# Patient Record
Sex: Male | Born: 1965 | Race: White | Hispanic: No | Marital: Married | Smoking: Never smoker
Health system: Southern US, Community
[De-identification: ages and names within clinical notes are randomized; demographics above are authoritative.]

## PROBLEM LIST (undated history)

## (undated) DIAGNOSIS — K589 Irritable bowel syndrome without diarrhea: Secondary | ICD-10-CM

## (undated) HISTORY — DX: Irritable bowel syndrome without diarrhea: K58.9

---

## 2004-12-18 ENCOUNTER — Ambulatory Visit: Payer: Self-pay | Admitting: Unknown Physician Specialty

## 2012-02-10 ENCOUNTER — Ambulatory Visit: Payer: Self-pay | Admitting: Internal Medicine

## 2012-03-22 ENCOUNTER — Other Ambulatory Visit: Payer: Self-pay | Admitting: Gastroenterology

## 2012-03-22 LAB — CLOSTRIDIUM DIFFICILE BY PCR

## 2012-03-24 LAB — STOOL CULTURE

## 2012-12-09 IMAGING — CT CT ABD-PELV W/ CM
1 of 2 series · 15 of 32 positions shown, 19 images · non-contrast
Comparison: none

REASON FOR EXAM: frequent diarrhea and cramping
COMMENTS:

PROCEDURE:     KCT - KCT ABDOMEN/PELVIS W  - February 10, 2012  [DATE]
RESULT:
TECHNIQUE: CT of the abdomen and pelvis is performed with 100 ml of
Xsovue-ORT iodinated intravenous contrast and oral contrast with images
reconstructed in the axial plane at 3.0 mm slice thickness.
There is no previous exam for comparison.

[Series 2: abd 3mm w 3.0 i40f 3 · axial · 0.76mm/px · z∈[-592,-160]mm · 15 of 158 slices shown, 19 images]
[im 7/158  soft-tissue]
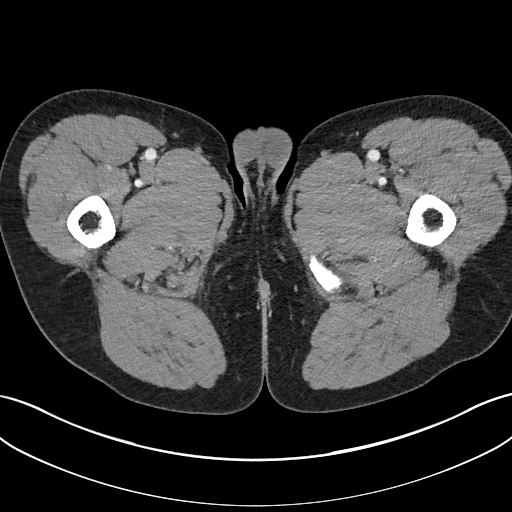
[im 7/158  bone]
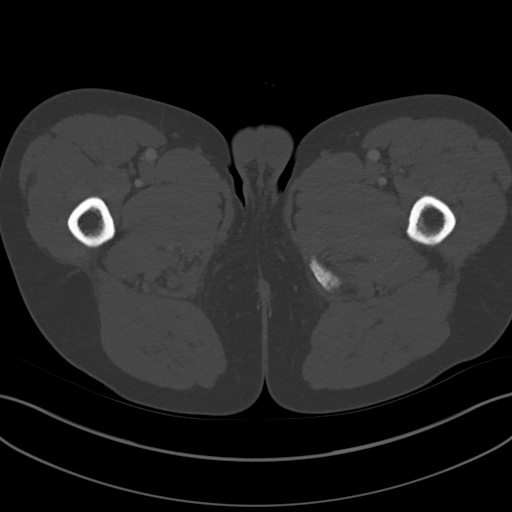
[im 19/158  soft-tissue]
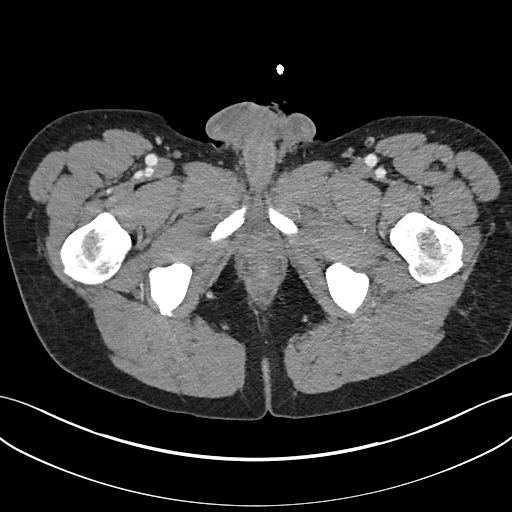
[im 31/158  soft-tissue]
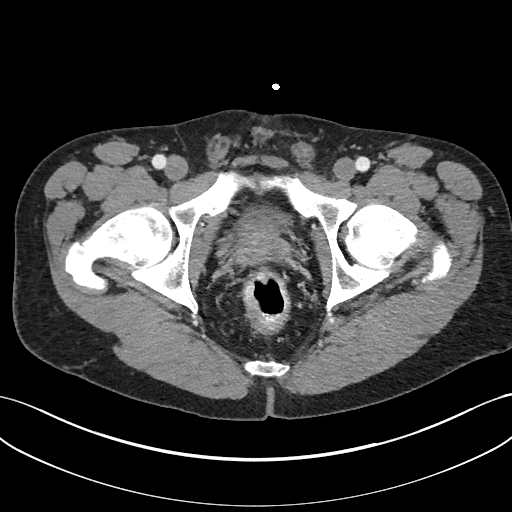
[im 43/158  soft-tissue]
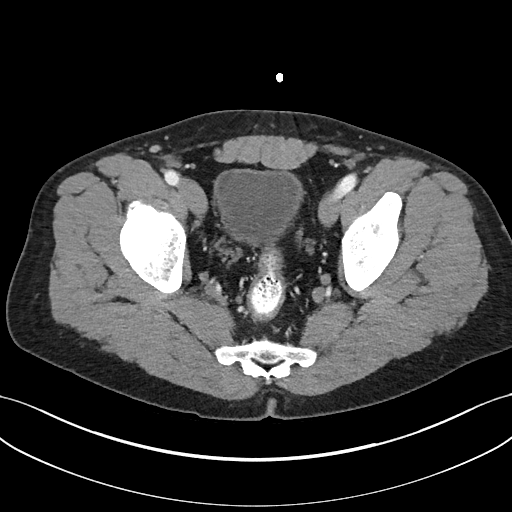
[im 55/158  soft-tissue]
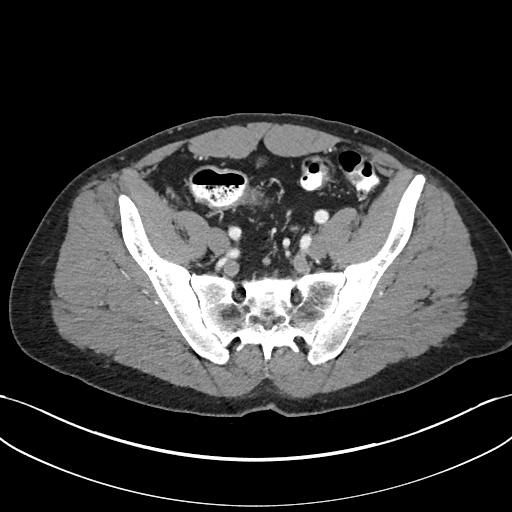
[im 67/158  soft-tissue]
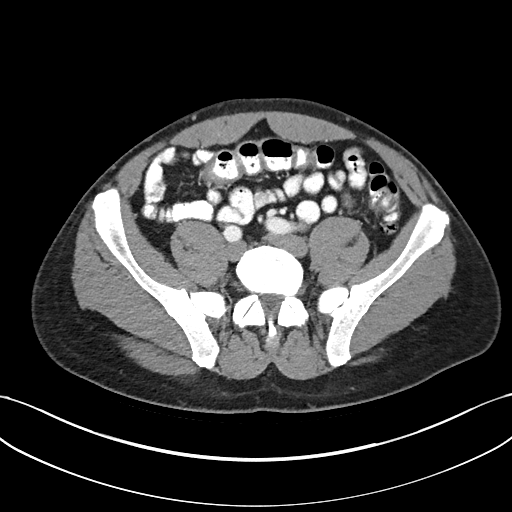
[im 79/158  soft-tissue]
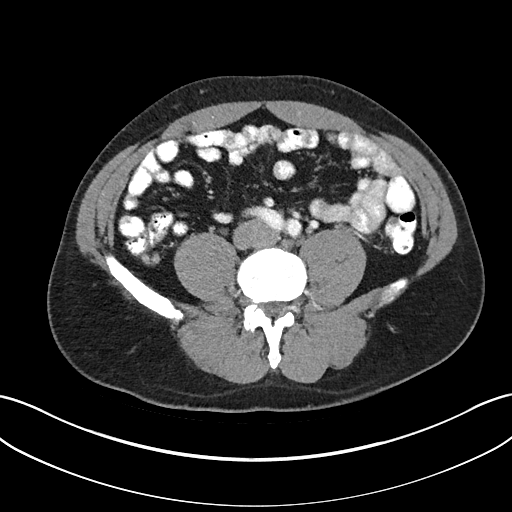
[im 91/158  soft-tissue]
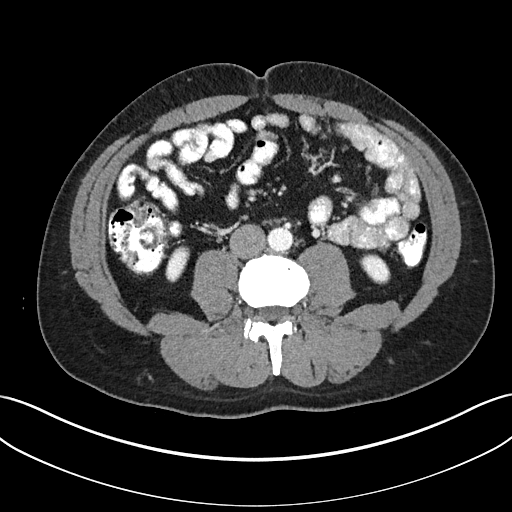
[im 103/158  soft-tissue]
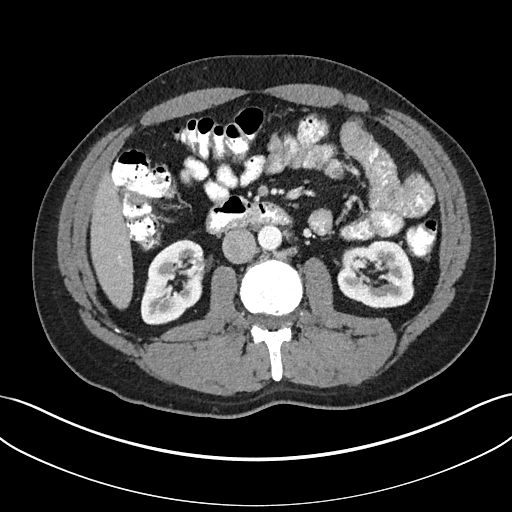
[im 103/158  bone]
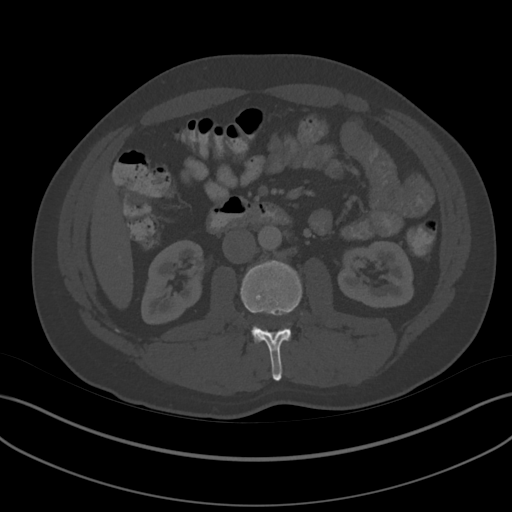
[im 115/158  soft-tissue]
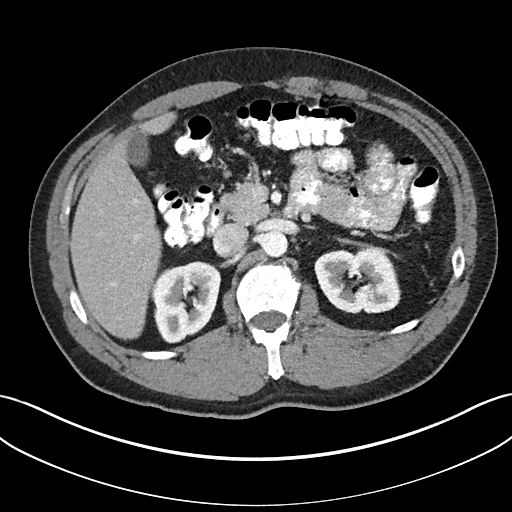
[im 127/158  soft-tissue]
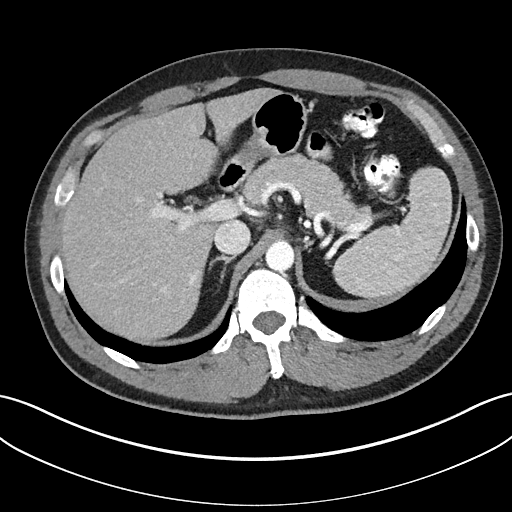
[im 133/158  lung]
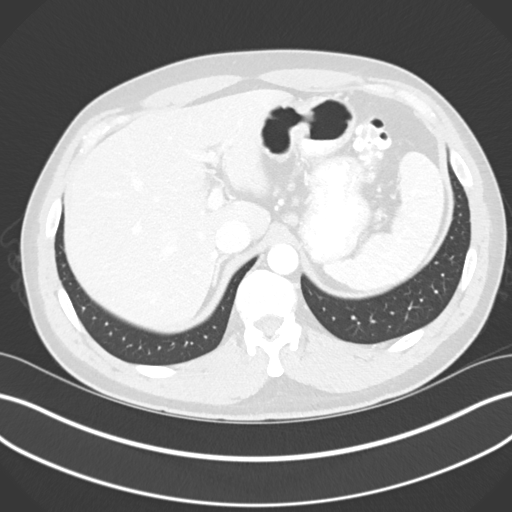
[im 139/158  soft-tissue]
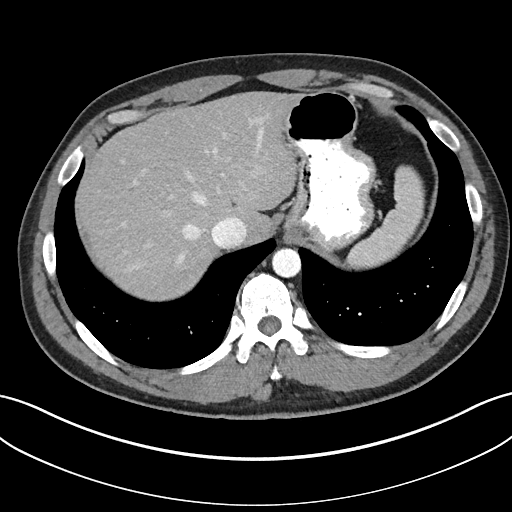
[im 139/158  lung]
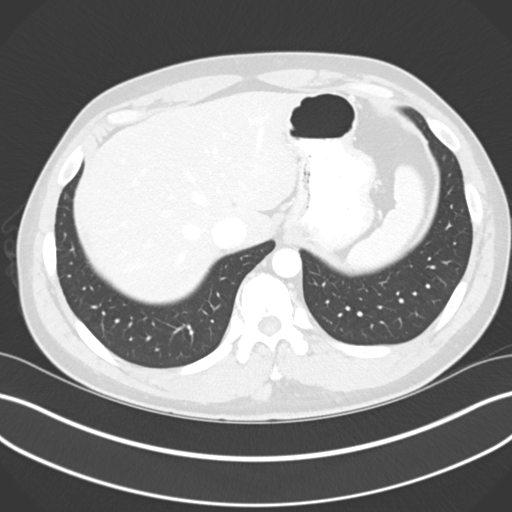
[im 145/158  lung]
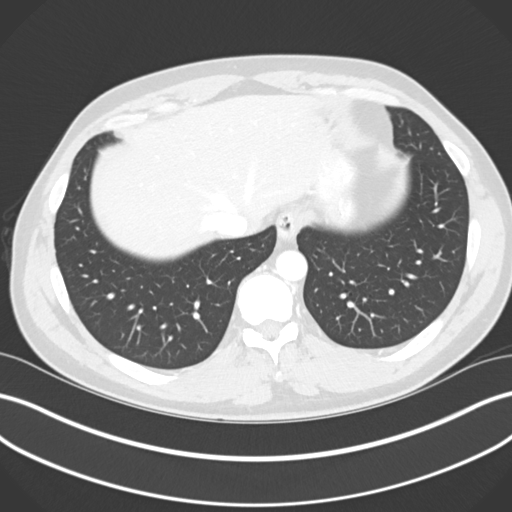
[im 151/158  soft-tissue]
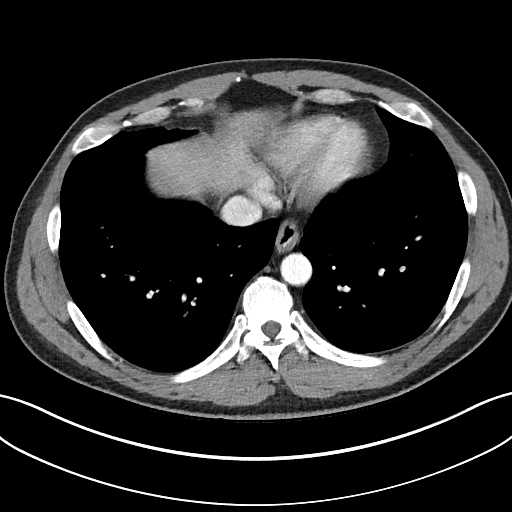
[im 151/158  lung]
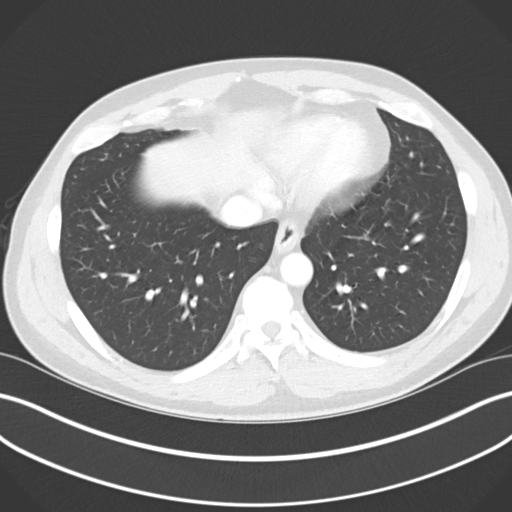

[15 of 32 positions shown; findings below may reference images not displayed]

FINDINGS: Images through the base of the lungs demonstrate normal aeration.
The heart size appears to be within normal limits. There is no pleural or
pericardial effusion. There is no discrete pulmonary nodule or mass.

The abdominal aorta is normal in caliber without dissection. There is no
evidence of ascites. There is no definite abnormality of the gallbladder
although ultrasound would be more sensitive to evaluate for small stones.
The spleen, pancreas, adrenal glands and kidneys appear to be within normal
limits. There is no evidence of abnormal small bowel or colonic distention
or wall thickening. No adenopathy is appreciated. No inflammatory stranding
is evident. There is an area in the midline supravesical region showing an
anterior to posterior dimension of 1.8 cm with a transverse dimension of
1.75 cm and a Hounsfield reading of approximately 7.0 Hounsfield units.
There appears to be a small linear connection from the inferior aspect of
this structure to the anterior/superior aspect of the bladder raising the
possibility of an urachal remnant and cyst. Urologic follow-up is
recommended. Intervertebral disc space narrowing is present at L2-3 with
chronic disc protrusion anteriorly and posteriorly with some calcification.
This may be causing some spinal canal narrowing at this level. Correlate
clinically. The appendix appears to be within normal limits.
IMPRESSION: 1.  Possible urachal remnant with cyst.
2.  Normal appearing appendix.
3.  No acute abnormality of the stomach or bowel. No adenopathy or free
fluid.

[REDACTED]

## 2013-04-06 ENCOUNTER — Encounter: Payer: Self-pay | Admitting: *Deleted

## 2013-04-28 ENCOUNTER — Encounter: Payer: Self-pay | Admitting: General Surgery

## 2013-04-28 ENCOUNTER — Ambulatory Visit (INDEPENDENT_AMBULATORY_CARE_PROVIDER_SITE_OTHER): Payer: BC Managed Care – PPO | Admitting: General Surgery

## 2013-04-28 VITALS — BP 122/78 | HR 80 | Resp 12 | Ht 67.0 in | Wt 207.0 lb

## 2013-04-28 DIAGNOSIS — Q644 Malformation of urachus: Secondary | ICD-10-CM | POA: Insufficient documentation

## 2013-04-28 NOTE — Patient Instructions (Signed)
Patient to return in 1 year for follow up.  

## 2013-04-28 NOTE — Progress Notes (Signed)
Patient ID: Leonard Mahoney, male   DOB: 04/01/1966, 47 y.o.   MRN: [id number]  Chief Complaint  Patient presents with  . Follow-up    HPI Leonard Mahoney is a 47 y.o. male who presents for a follow up of a abdominal/pelvis CT finding frpm last yr. The patient denies any bowel problems at this time. CT last yr showed a small cyst in urachus-1.8cm. Here for f/u.   HPI  Past Medical History  Diagnosis Date  . IBS (irritable bowel syndrome)     History reviewed. No pertinent past surgical history.  History reviewed. No pertinent family history.  Social History History  Substance Use Topics  . Smoking status: Never Smoker   . Smokeless tobacco: Never Used  . Alcohol Use: Yes     Comment: 5/week    No Known Allergies  No current outpatient prescriptions on file.   No current facility-administered medications for this visit.    Review of Systems Review of Systems  Constitutional: Negative.   Respiratory: Negative.   Cardiovascular: Negative.   Gastrointestinal: Negative.     Blood pressure 122/78, pulse 80, resp. rate 12, height 5\' 7"  (1.702 m), weight 207 lb (93.895 kg).  Physical Exam Physical Exam  Constitutional: He is oriented to person, place, and time. He appears well-developed and well-nourished.  Eyes: Conjunctivae are normal. No scleral icterus.  Abdominal: Soft. Normal appearance and bowel sounds are normal. There is no hepatosplenomegaly. There is no tenderness. No hernia.  Neurological: He is alert and oriented to person, place, and time.  Skin: Skin is warm and dry.    Data Reviewed CT from last yr.  [place] Lower mid abdominal wall done today. The well defined cystic mass is seen 8cm below umbilicus. It lies below the fascia.  It measures max size of 1.67 cm. It is anechoic and shows enhancement. Finding consistent with a cyst in Urachus.  Assessment    Stable small urachal cyst.     Plan    Continue periodic f/u. Next eval in 1 yr.          SANKAR,SEEPLAPUTHUR G 04/28/2013, 12:06 PM

## 2013-08-23 DIAGNOSIS — Q644 Malformation of urachus: Secondary | ICD-10-CM

## 2013-09-30 DIAGNOSIS — Q644 Malformation of urachus: Secondary | ICD-10-CM

## 2014-04-26 ENCOUNTER — Ambulatory Visit (INDEPENDENT_AMBULATORY_CARE_PROVIDER_SITE_OTHER): Payer: No Typology Code available for payment source | Admitting: General Surgery

## 2014-04-26 ENCOUNTER — Ambulatory Visit: Payer: No Typology Code available for payment source

## 2014-04-26 ENCOUNTER — Encounter: Payer: Self-pay | Admitting: General Surgery

## 2014-04-26 VITALS — BP 122/84 | HR 62 | Resp 12 | Ht 67.0 in | Wt 207.0 lb

## 2014-04-26 DIAGNOSIS — Q644 Malformation of urachus: Secondary | ICD-10-CM

## 2014-04-26 NOTE — Patient Instructions (Signed)
The patient is aware to call back for any questions or concerns.  

## 2014-04-26 NOTE — Progress Notes (Signed)
Here today for follow up cyst abdominal wall/urachus ultrasound. The patient denies any bowel problems at this time. Denies pain.  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 Asymptomatic, last year on ultrasound it measured 1.6 cm.Initial length 2 yrs ago was 1.8 cm Today it measures 1.77 cm transverse and 1.95 cm vertical, essentially stable. Follow up in 2 years or sooner as needed..Leonard Mahoney

## 2016-02-20 ENCOUNTER — Encounter: Payer: Self-pay | Admitting: *Deleted

## 2016-04-22 ENCOUNTER — Ambulatory Visit: Payer: Self-pay | Admitting: General Surgery
